# Patient Record
Sex: Female | Born: 1970 | Race: White | Hispanic: No | Marital: Married | Smoking: Never smoker
Health system: Southern US, Community
[De-identification: ages and names within clinical notes are randomized; demographics above are authoritative.]

## PROBLEM LIST (undated history)

## (undated) DIAGNOSIS — K5792 Diverticulitis of intestine, part unspecified, without perforation or abscess without bleeding: Secondary | ICD-10-CM

## (undated) HISTORY — PX: ESSURE TUBAL LIGATION: SUR464

---

## 2018-02-04 ENCOUNTER — Other Ambulatory Visit: Payer: Self-pay

## 2018-02-04 ENCOUNTER — Emergency Department (HOSPITAL_BASED_OUTPATIENT_CLINIC_OR_DEPARTMENT_OTHER): Payer: Managed Care, Other (non HMO)

## 2018-02-04 ENCOUNTER — Encounter (HOSPITAL_BASED_OUTPATIENT_CLINIC_OR_DEPARTMENT_OTHER): Payer: Self-pay

## 2018-02-04 ENCOUNTER — Emergency Department (HOSPITAL_BASED_OUTPATIENT_CLINIC_OR_DEPARTMENT_OTHER)
Admission: EM | Admit: 2018-02-04 | Discharge: 2018-02-04 | Disposition: A | Discharge status: Home / Self Care | Payer: Managed Care, Other (non HMO) | Attending: Emergency Medicine | Admitting: Emergency Medicine

## 2018-02-04 DIAGNOSIS — K5792 Diverticulitis of intestine, part unspecified, without perforation or abscess without bleeding: Secondary | ICD-10-CM | POA: Insufficient documentation

## 2018-02-04 DIAGNOSIS — R1031 Right lower quadrant pain: Secondary | ICD-10-CM | POA: Diagnosis not present

## 2018-02-04 DIAGNOSIS — R1032 Left lower quadrant pain: Secondary | ICD-10-CM | POA: Diagnosis present

## 2018-02-04 HISTORY — DX: Diverticulitis of intestine, part unspecified, without perforation or abscess without bleeding: K57.92

## 2018-02-04 LAB — URINALYSIS, ROUTINE W REFLEX MICROSCOPIC
Bilirubin Urine: NEGATIVE
Glucose, UA: NEGATIVE mg/dL
Ketones, ur: NEGATIVE mg/dL
LEUKOCYTES UA: NEGATIVE
Nitrite: NEGATIVE
PH: 6 (ref 5.0–8.0)
Protein, ur: NEGATIVE mg/dL
SPECIFIC GRAVITY, URINE: 1.02 (ref 1.005–1.030)

## 2018-02-04 LAB — COMPREHENSIVE METABOLIC PANEL
ALT: 15 U/L (ref 0–44)
AST: 17 U/L (ref 15–41)
Albumin: 4.3 g/dL (ref 3.5–5.0)
Alkaline Phosphatase: 114 U/L (ref 38–126)
Anion gap: 9 (ref 5–15)
BUN: 17 mg/dL (ref 6–20)
CHLORIDE: 101 mmol/L (ref 98–111)
CO2: 24 mmol/L (ref 22–32)
CREATININE: 0.82 mg/dL (ref 0.44–1.00)
Calcium: 9.3 mg/dL (ref 8.9–10.3)
GFR calc non Af Amer: >60 mL/min (ref >60)
Glucose, Bld: 111 mg/dL — ABNORMAL HIGH (ref 70–99)
Potassium: 3.8 mmol/L (ref 3.5–5.1)
SODIUM: 134 mmol/L — AB (ref 135–145)
Total Bilirubin: 0.2 mg/dL — ABNORMAL LOW (ref 0.3–1.2)
Total Protein: 7.9 g/dL (ref 6.5–8.1)

## 2018-02-04 LAB — CBC
HEMATOCRIT: 41.2 % (ref 36.0–46.0)
HEMOGLOBIN: 13.1 g/dL (ref 12.0–15.0)
MCH: 26.6 pg (ref 26.0–34.0)
MCHC: 31.8 g/dL (ref 30.0–36.0)
MCV: 83.7 fL (ref 80.0–100.0)
NRBC: 0 % (ref 0.0–0.2)
Platelets: 269 10*3/uL (ref 150–400)
RBC: 4.92 MIL/uL (ref 3.87–5.11)
RDW: 13.2 % (ref 11.5–15.5)
WBC: 13.2 10*3/uL — ABNORMAL HIGH (ref 4.0–10.5)

## 2018-02-04 LAB — URINALYSIS, MICROSCOPIC (REFLEX)

## 2018-02-04 LAB — PREGNANCY, URINE: Preg Test, Ur: NEGATIVE

## 2018-02-04 LAB — LIPASE, BLOOD: LIPASE: 33 U/L (ref 11–51)

## 2018-02-04 MED ORDER — IOPAMIDOL (ISOVUE-300) INJECTION 61%
100.0000 mL | Freq: Once | INTRAVENOUS | Status: AC | PRN
  Administered 2018-02-04: 100 mL via INTRAVENOUS

## 2018-02-04 MED ORDER — FENTANYL CITRATE (PF) 100 MCG/2ML IJ SOLN
50.0000 ug | Freq: Once | INTRAMUSCULAR | Status: AC
  Administered 2018-02-04: 50 ug via INTRAVENOUS
  Filled 2018-02-04: qty 2

## 2018-02-04 MED ORDER — ONDANSETRON HCL 4 MG PO TABS: 4.0000 mg | ORAL_TABLET | Freq: Four times a day (QID) | ORAL | 0 refills | Status: AC

## 2018-02-04 MED ORDER — METRONIDAZOLE 500 MG PO TABS
ORAL_TABLET | ORAL | Status: AC
  Filled 2018-02-04: qty 1

## 2018-02-04 MED ORDER — CIPROFLOXACIN HCL 500 MG PO TABS: 500.0000 mg | ORAL_TABLET | Freq: Two times a day (BID) | ORAL | 0 refills | Status: AC

## 2018-02-04 MED ORDER — METRONIDAZOLE 500 MG PO TABS: 500.0000 mg | ORAL_TABLET | Freq: Three times a day (TID) | ORAL | 0 refills | Status: AC

## 2018-02-04 MED ORDER — IOPAMIDOL (ISOVUE-300) INJECTION 61%: 80.0000 mL | Freq: Once | INTRAVENOUS | Status: DC | PRN

## 2018-02-04 MED ORDER — CIPROFLOXACIN HCL 500 MG PO TABS
ORAL_TABLET | ORAL | Status: AC
  Filled 2018-02-04: qty 1

## 2018-02-04 MED ORDER — LACTATED RINGERS IV BOLUS
1000.0000 mL | Freq: Once | INTRAVENOUS | Status: AC
  Administered 2018-02-04: 1000 mL via INTRAVENOUS

## 2018-02-04 MED ORDER — METRONIDAZOLE 500 MG PO TABS
500.0000 mg | ORAL_TABLET | Freq: Once | ORAL | Status: AC
  Administered 2018-02-04: 500 mg via ORAL

## 2018-02-04 MED ORDER — CIPROFLOXACIN HCL 500 MG PO TABS
500.0000 mg | ORAL_TABLET | Freq: Once | ORAL | Status: AC
  Administered 2018-02-04: 500 mg via ORAL

## 2018-02-04 NOTE — ED Provider Notes (Signed)
[facility] EMERGENCY DEPARTMENT Provider Note   CSN: [id number] Arrival date & time: 02/04/18  3295     History   Chief Complaint Chief Complaint  Patient presents with  . Abdominal Pain    HPI Joyce Andersen is a 47 y.o. female.  The history is provided by the patient.  Abdominal Pain   This is a new problem. The current episode started yesterday. The problem occurs constantly. The problem has been gradually worsening. The pain is associated with an unknown factor. The pain is located in the LLQ and RLQ. The quality of the pain is aching and dull. The pain is at a severity of 5/10. The pain is moderate. Pertinent negatives include anorexia, fever, belching, diarrhea, flatus, hematochezia, melena, nausea, vomiting, constipation, dysuria, frequency, hematuria, headaches, arthralgias and myalgias. Nothing aggravates the symptoms. Nothing relieves the symptoms.    Past Medical History:  Diagnosis Date  . Diverticulitis     There are no active problems to display for this patient.   Past Surgical History:  Procedure Laterality Date  . ESSURE TUBAL LIGATION       OB History   None      Home Medications    Prior to Admission medications   Medication Sig Start Date End Date Taking? Authorizing Provider  ciprofloxacin (CIPRO) 500 MG tablet Take 1 tablet (500 mg total) by mouth 2 (two) times daily for 10 days. 02/04/18 02/14/18  Kwinton Maahs, DO  metroNIDAZOLE (FLAGYL) 500 MG tablet Take 1 tablet (500 mg total) by mouth 3 (three) times daily for 10 days. 02/04/18 02/14/18  Virgina Norfolk, DO    Family History No family history on file.  Social History Social History   Tobacco Use  . Smoking status: Never Smoker  . Smokeless tobacco: Never Used  Substance Use Topics  . Alcohol use: Yes    Comment: rare  . Drug use: Never     Allergies   Codeine and Penicillins   Review of Systems Review of Systems  Constitutional: Negative for chills and  fever.  HENT: Negative for ear pain and sore throat.   Eyes: Negative for pain and visual disturbance.  Respiratory: Negative for cough and shortness of breath.   Cardiovascular: Negative for chest pain and palpitations.  Gastrointestinal: Positive for abdominal pain. Negative for anorexia, constipation, diarrhea, flatus, hematochezia, melena, nausea and vomiting.  Genitourinary: Negative for dysuria, frequency and hematuria.  Musculoskeletal: Negative for arthralgias, back pain and myalgias.  Skin: Negative for color change and rash.  Neurological: Negative for seizures, syncope and headaches.  All other systems reviewed and are negative.    Physical Exam Updated Vital Signs BP (!) 126/91 (BP Location: Left Arm)   Pulse 94   Temp 98.9 F (37.2 C) (Oral)   Resp 20   Ht 5\' 6"  (1.676 m)   Wt 76.7 kg   SpO2 100%   BMI 27.28 kg/m   Physical Exam  Constitutional: She appears well-developed and well-nourished. No distress.  HENT:  Head: Normocephalic and atraumatic.  Eyes: Pupils are equal, round, and reactive to light. Conjunctivae and EOM are normal.  Neck: Neck supple.  Cardiovascular: Normal rate, regular rhythm, normal heart sounds and intact distal pulses.  No murmur heard. Pulmonary/Chest: Effort normal and breath sounds normal. No respiratory distress.  Abdominal: Soft. Normal appearance. There is tenderness in the right lower quadrant and left lower quadrant. There is tenderness at McBurney's point. There is no guarding.  Musculoskeletal: She exhibits no edema.  Neurological:  She is alert.  Skin: Skin is warm and dry. Capillary refill takes less than 2 seconds.  Psychiatric: She has a normal mood and affect.  Nursing note and vitals reviewed.    ED Treatments / Results  Labs (all labs ordered are listed, but only abnormal results are displayed) Labs Reviewed  COMPREHENSIVE METABOLIC PANEL - Abnormal; Notable for the following components:      Result Value    Sodium 134 (*)    Glucose, Bld 111 (*)    Total Bilirubin 0.2 (*)    All other components within normal limits  CBC - Abnormal; Notable for the following components:   WBC 13.2 (*)    All other components within normal limits  URINALYSIS, ROUTINE W REFLEX MICROSCOPIC - Abnormal; Notable for the following components:   Hgb urine dipstick SMALL (*)    All other components within normal limits  URINALYSIS, MICROSCOPIC (REFLEX) - Abnormal; Notable for the following components:   Bacteria, UA FEW (*)    All other components within normal limits  LIPASE, BLOOD  PREGNANCY, URINE    EKG None  Radiology Ct Abdomen Pelvis W Contrast  Result Date: 02/04/2018 CLINICAL DATA:  Abdomen distension EXAM: CT ABDOMEN AND PELVIS WITH CONTRAST TECHNIQUE: Multidetector CT imaging of the abdomen and pelvis was performed using the standard protocol following bolus administration of intravenous contrast. CONTRAST:  ISOVUE-300 IOPAMIDOL (ISOVUE-300) INJECTION 61% COMPARISON:  None. FINDINGS: Lower chest: Lung bases demonstrate no acute consolidation or effusion. The heart size is normal Hepatobiliary: No focal liver abnormality is seen. No gallstones, gallbladder wall thickening, or biliary dilatation. Pancreas: Unremarkable. No pancreatic ductal dilatation or surrounding inflammatory changes. Spleen: Normal in size without focal abnormality. Adrenals/Urinary Tract: Adrenal glands are unremarkable. Kidneys are normal, without renal calculi, focal lesion, or hydronephrosis. Bladder is unremarkable. Stomach/Bowel: Stomach within normal limits. No dilated small bowel. Negative appendix. Scattered diverticula in the sigmoid colon. Focal wall thickening within the distal sigmoid colon with moderate surrounding inflammatory change. No extraluminal gas or organized abscess. Vascular/Lymphatic: Nonaneurysmal aorta. No significantly enlarged lymph nodes Reproductive: Tubal occlusion devices. 2 cm slightly dense  ring-enhancing right adnexal lesion. Other: Small free fluid in the pelvis.  No free air. Musculoskeletal: No acute or significant osseous findings. IMPRESSION: 1. Focal wall thickening with moderate inflammatory changes at the sigmoid colon with scattered diverticula present, suspected to represent an acute diverticulitis versus short segment colitis. No perforation or well organized abscess. Given focal involvement and slightly convex configuration, with suggest follow-up colonoscopy after resolution of acute symptoms to exclude the presence of a mass. 2. Small amount of free fluid in the pelvis. Negative for appendicitis 3. 2 cm slightly thick-walled ring-enhancing lesion in the right adnexa for which nonemergent pelvic ultrasound is recommended. Electronically Signed   By: Jasmine Pang M.D.   On: 02/04/2018 20:52    Procedures Procedures (including critical care time)  Medications Ordered in ED Medications  lactated ringers bolus 1,000 mL (1,000 mLs Intravenous New Bag/Given 02/04/18 1952)  fentaNYL (SUBLIMAZE) injection 50 mcg (50 mcg Intravenous Given 02/04/18 1951)  iopamidol (ISOVUE-300) 61 % injection 100 mL (100 mLs Intravenous Contrast Given 02/04/18 2015)     Initial Impression / Assessment and Plan / ED Course  I have reviewed the triage vital signs and the nursing notes.  Pertinent labs & imaging results that were available during my care of the patient were reviewed by me and considered in my medical decision making (see chart for details).  Lavonne Kinderman is a 47 year old female history of diverticulitis who presents to the ED with lower abdominal pain.  Patient with normal vitals.  No fever.  Patient with lower abdominal pain for 2 days.  Tender in the right lower quadrant and left lower quadrant on exam.  No nausea, no vomiting.  No melena, no hematochezia, no diarrhea.  Patient denies any urinary symptoms.  CT scan showed acute diverticulitis without any complications.  No  abscess, no perforation.  Patient otherwise with mild leukocytosis but no significant anemia, electrolyte abnormality, kidney injury.  Patient with negative urinalysis.  Negative pregnancy test.  Patient to be treated outpatient with ciprofloxacin and Flagyl.  Recommend close follow-up with primary care doctor and GI.  She had recent colonoscopy.  She is given strict return precautions and discharged from ED in good condition.  Made aware of need for nonemergent pelvic ultrasound.  This chart was dictated using voice recognition software.  Despite best efforts to proofread,  errors can occur which can change the documentation meaning.   Final Clinical Impressions(s) / ED Diagnoses   Final diagnoses:  Acute diverticulitis    ED Discharge Orders         Ordered    ciprofloxacin (CIPRO) 500 MG tablet  2 times daily     02/04/18 2108    metroNIDAZOLE (FLAGYL) 500 MG tablet  3 times daily     02/04/18 2108           Virgina Norfolk, DO 02/04/18 2112

## 2018-02-04 NOTE — ED Triage Notes (Signed)
C/o abd pain x 2 weeks ago for 3-4 days-started again yesterday-seen at [facility] and sent to ED with possible appendicitis-pt with slow gait

## 2018-02-04 NOTE — ED Notes (Signed)
Patient transported to CT 

## 2020-05-25 IMAGING — CT CT ABD-PELV W/ CM
2 of 5 series · 15 of 46 positions shown, 17 images · IV contrast (APPLIED)
Comparison: None.

CLINICAL DATA: Abdomen distension

EXAM:
CT ABDOMEN AND PELVIS WITH CONTRAST
TECHNIQUE: Multidetector CT imaging of the abdomen and pelvis was performed
using the standard protocol following bolus administration of
intravenous contrast.
CONTRAST:  100mL [id number] IOPAMIDOL ([id number]) INJECTION 61%

[Series 2: axial st · axial · 0.77mm/px · z∈[+675,+1155]mm · 12 of 108 slices shown, 14 images]
[im 6/108  soft-tissue]
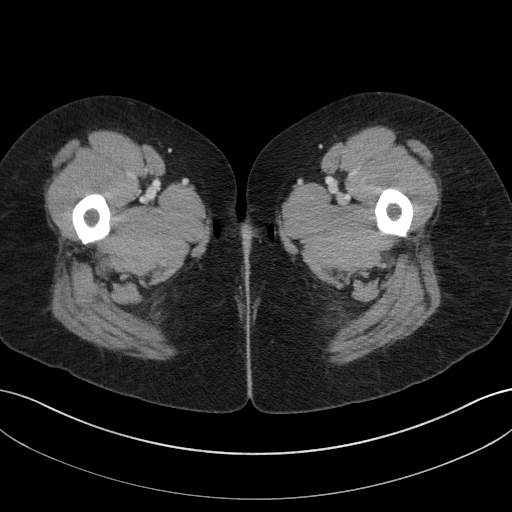
[im 6/108  bone]
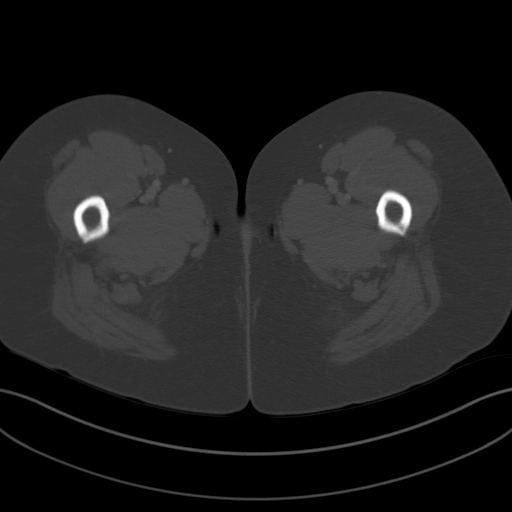
[im 17/108  soft-tissue]
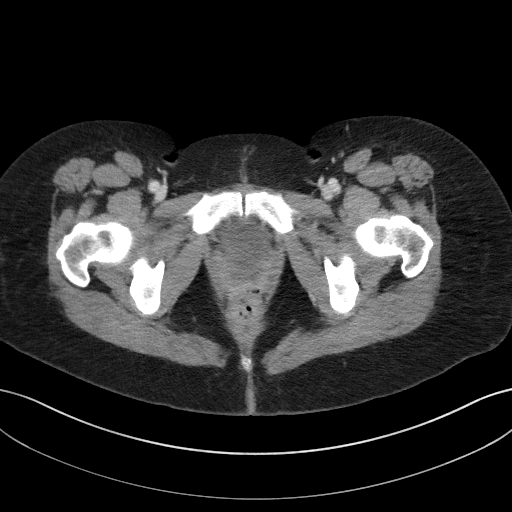
[im 23/108  soft-tissue]
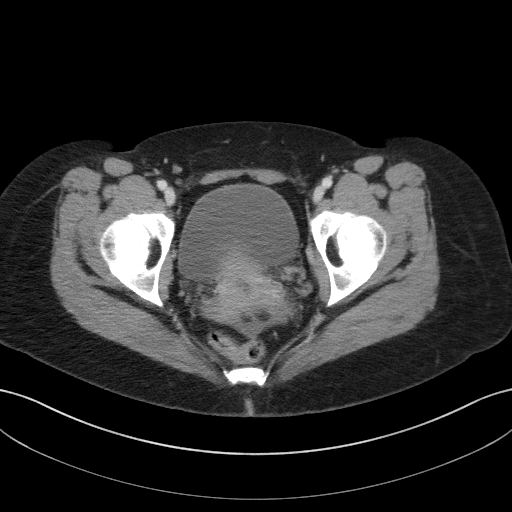
[im 34/108  soft-tissue]
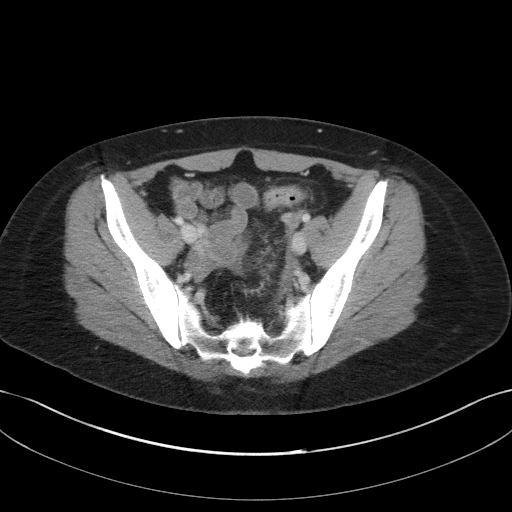
[im 40/108  soft-tissue]
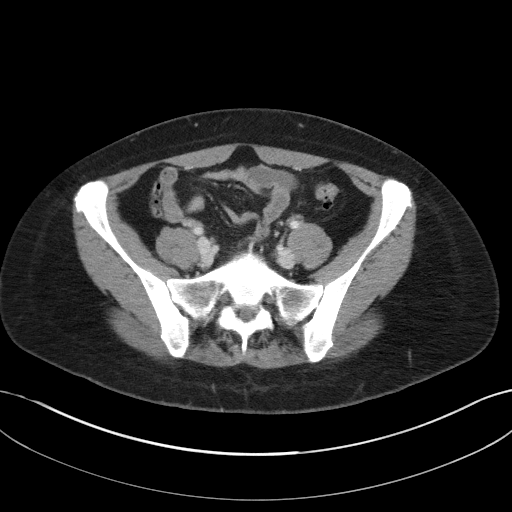
[im 51/108  soft-tissue]
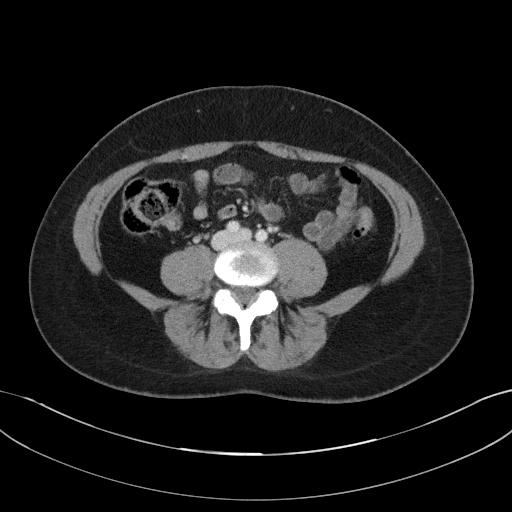
[im 57/108  soft-tissue]
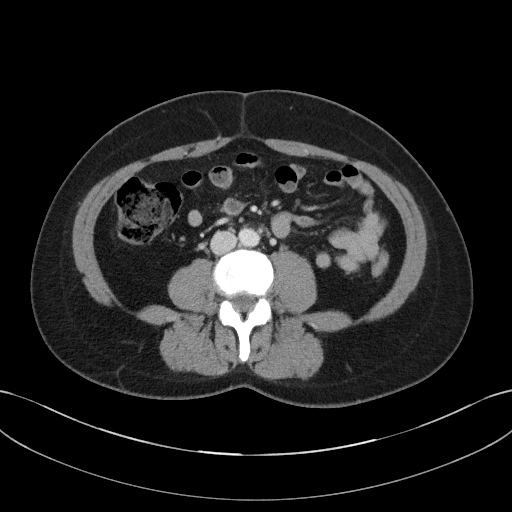
[im 68/108  soft-tissue]
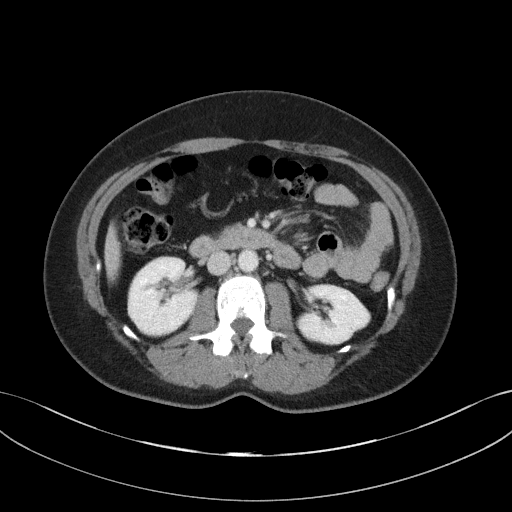
[im 74/108  soft-tissue]
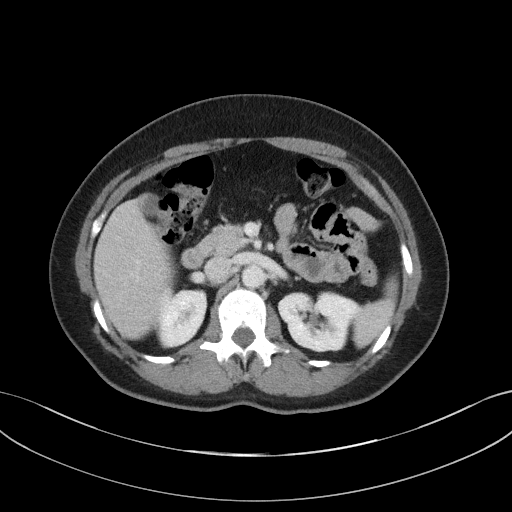
[im 74/108  bone]
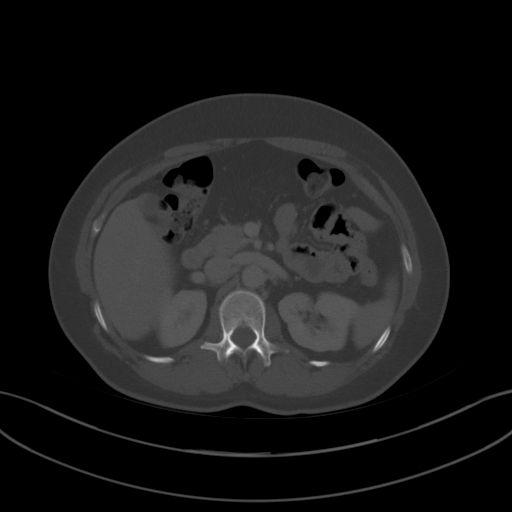
[im 85/108  soft-tissue]
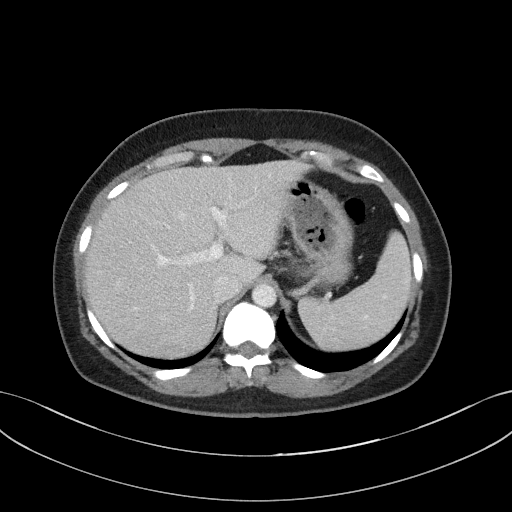
[im 91/108  soft-tissue]
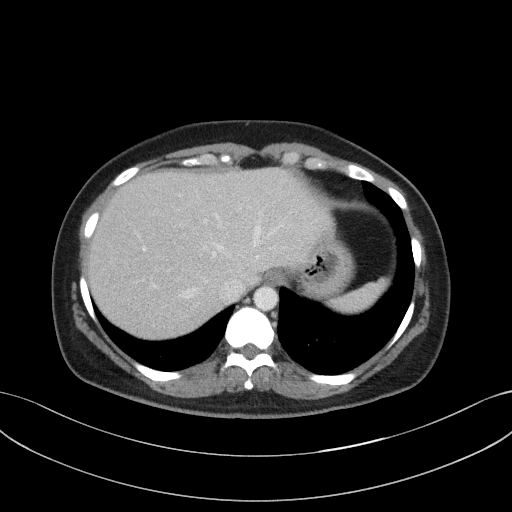
[im 102/108  soft-tissue]
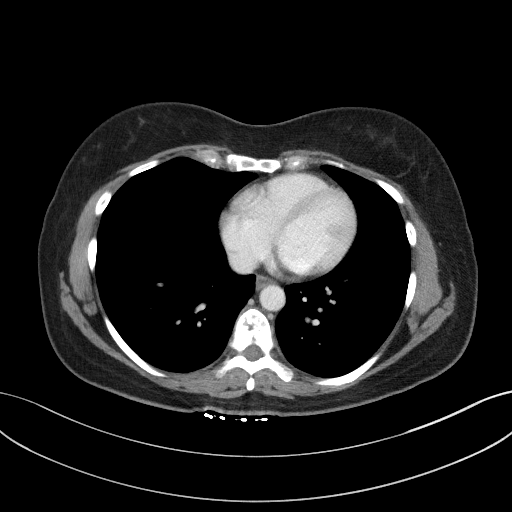

[Series 5: coronal st · coronal · 0.62mm/px · 3 of 100 slices shown]
[im 34/100  soft-tissue]
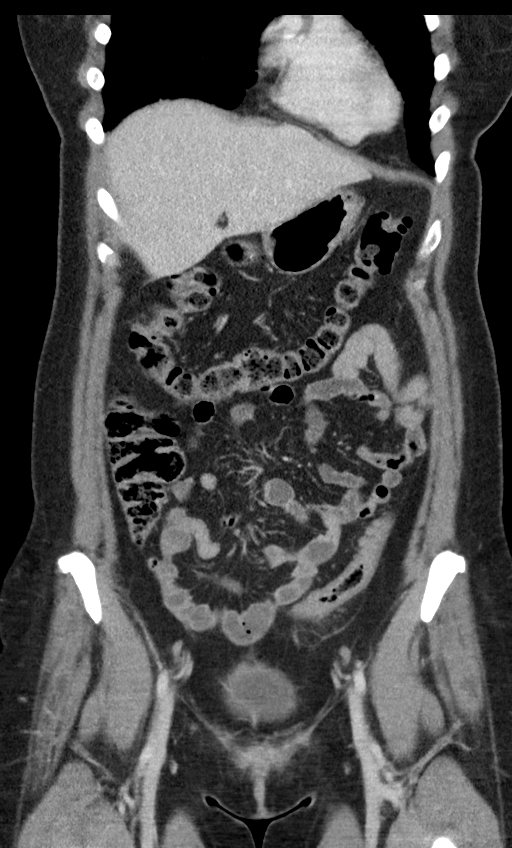
[im 45/100  soft-tissue]
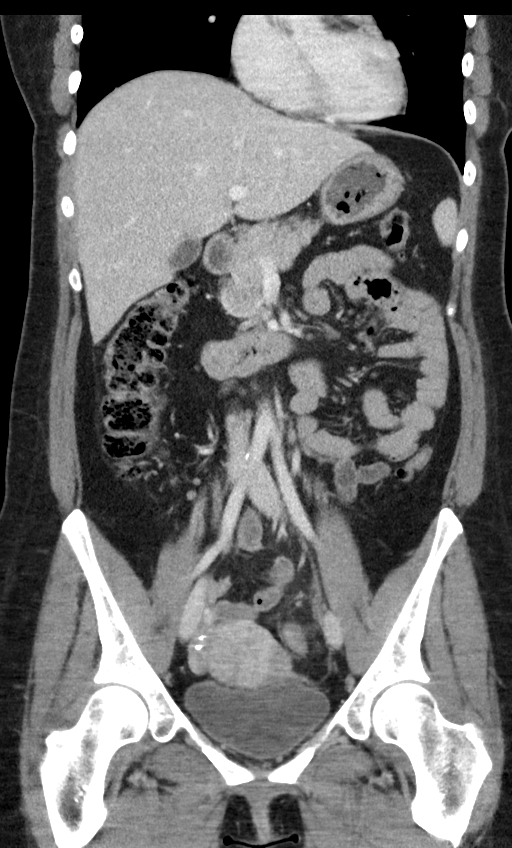
[im 56/100  soft-tissue]
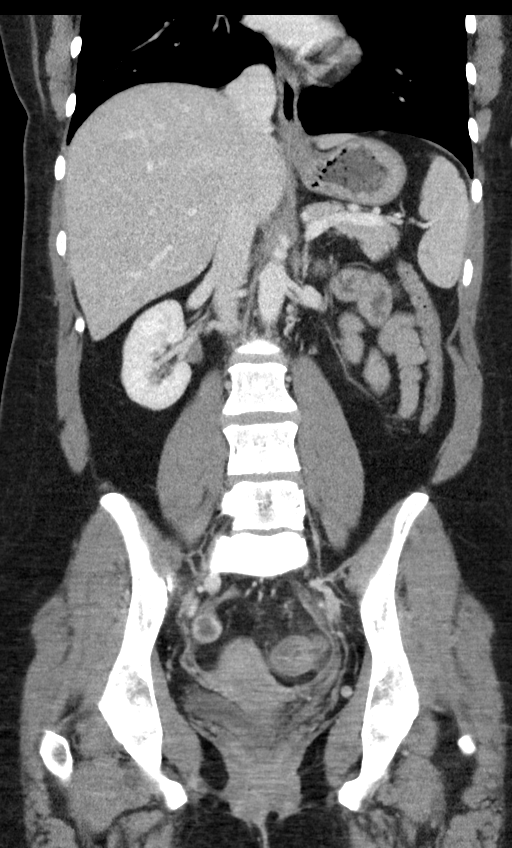

[15 of 46 positions shown; findings below may reference images not displayed]

FINDINGS: Lower chest: Lung bases demonstrate no acute consolidation or
effusion. The heart size is normal

Hepatobiliary: No focal liver abnormality is seen. No gallstones,
gallbladder wall thickening, or biliary dilatation.

Pancreas: Unremarkable. No pancreatic ductal dilatation or
surrounding inflammatory changes.

Spleen: Normal in size without focal abnormality.

Adrenals/Urinary Tract: Adrenal glands are unremarkable. Kidneys are
normal, without renal calculi, focal lesion, or hydronephrosis.
Bladder is unremarkable.

Stomach/Bowel: Stomach within normal limits. No dilated small bowel.
Negative appendix. Scattered diverticula in the sigmoid colon. Focal
wall thickening within the distal sigmoid colon with moderate
surrounding inflammatory change. No extraluminal gas or organized
abscess.

Vascular/Lymphatic: Nonaneurysmal aorta. No significantly enlarged
lymph nodes

Reproductive: Tubal occlusion devices. 2 cm slightly dense
ring-enhancing right adnexal lesion.

Other: Small free fluid in the pelvis.  No free air.

Musculoskeletal: No acute or significant osseous findings.
IMPRESSION: 1. Focal wall thickening with moderate inflammatory changes at the
sigmoid colon with scattered diverticula present, suspected to
represent an acute diverticulitis versus short segment colitis. No
perforation or well organized abscess. Given focal involvement and
slightly convex configuration, with suggest follow-up colonoscopy
after resolution of acute symptoms to exclude the presence of a
mass.
2. Small amount of free fluid in the pelvis. Negative for
appendicitis
3. 2 cm slightly thick-walled ring-enhancing lesion in the right
adnexa for which nonemergent pelvic ultrasound is recommended.

## 2023-02-14 ENCOUNTER — Ambulatory Visit: Payer: Managed Care, Other (non HMO) | Admitting: Diagnostic Neuroimaging
